# Patient Record
Sex: Male | Born: 1995 | Race: White | Hispanic: No | Marital: Single | Smoking: Current some day smoker
Health system: Southern US, Community
[De-identification: ages and names within clinical notes are randomized; demographics above are authoritative.]

---

## 2014-10-12 ENCOUNTER — Emergency Department: Payer: 59

## 2014-10-12 ENCOUNTER — Encounter: Payer: Self-pay | Admitting: General Practice

## 2014-10-12 ENCOUNTER — Emergency Department
Admission: EM | Admit: 2014-10-12 | Discharge: 2014-10-12 | Disposition: A | Discharge status: Home / Self Care | Payer: 59 | Attending: Emergency Medicine | Admitting: Emergency Medicine

## 2014-10-12 DIAGNOSIS — Z720 Tobacco use: Secondary | ICD-10-CM | POA: Insufficient documentation

## 2014-10-12 DIAGNOSIS — W25XXXA Contact with sharp glass, initial encounter: Secondary | ICD-10-CM | POA: Diagnosis not present

## 2014-10-12 DIAGNOSIS — Y92009 Unspecified place in unspecified non-institutional (private) residence as the place of occurrence of the external cause: Secondary | ICD-10-CM | POA: Insufficient documentation

## 2014-10-12 DIAGNOSIS — Y998 Other external cause status: Secondary | ICD-10-CM | POA: Insufficient documentation

## 2014-10-12 DIAGNOSIS — S91311A Laceration without foreign body, right foot, initial encounter: Secondary | ICD-10-CM | POA: Diagnosis not present

## 2014-10-12 DIAGNOSIS — Y9389 Activity, other specified: Secondary | ICD-10-CM | POA: Insufficient documentation

## 2014-10-12 MED ORDER — KETOROLAC TROMETHAMINE 10 MG PO TABS
ORAL_TABLET | ORAL | Status: AC
  Administered 2014-10-12: 10 mg via ORAL
  Filled 2014-10-12: qty 1

## 2014-10-12 MED ORDER — BACITRACIN ZINC 500 UNIT/GM EX OINT: TOPICAL_OINTMENT | CUTANEOUS | Status: AC

## 2014-10-12 MED ORDER — TRAMADOL HCL 50 MG PO TABS
50.0000 mg | ORAL_TABLET | Freq: Once | ORAL | Status: AC
  Administered 2014-10-12: 50 mg via ORAL

## 2014-10-12 MED ORDER — KETOROLAC TROMETHAMINE 10 MG PO TABS
10.0000 mg | ORAL_TABLET | Freq: Once | ORAL | Status: AC
  Administered 2014-10-12: 10 mg via ORAL

## 2014-10-12 MED ORDER — CEPHALEXIN 500 MG PO CAPS: 500.0000 mg | ORAL_CAPSULE | Freq: Four times a day (QID) | ORAL | Status: AC

## 2014-10-12 MED ORDER — TRAMADOL HCL 50 MG PO TABS
ORAL_TABLET | ORAL | Status: AC
  Administered 2014-10-12: 50 mg via ORAL
  Filled 2014-10-12: qty 1

## 2014-10-12 MED ORDER — BACITRACIN ZINC 500 UNIT/GM EX OINT
TOPICAL_OINTMENT | CUTANEOUS | Status: AC
Start: 2014-10-12 — End: 2014-10-12
  Administered 2014-10-12: 1 via TOPICAL
  Filled 2014-10-12: qty 0.9

## 2014-10-12 MED ORDER — IBUPROFEN 800 MG PO TABS: 800.0000 mg | ORAL_TABLET | Freq: Three times a day (TID) | ORAL | Status: AC | PRN

## 2014-10-12 MED ORDER — BACITRACIN 500 UNIT/GM EX OINT
1.0000 "application " | TOPICAL_OINTMENT | Freq: Two times a day (BID) | CUTANEOUS | Status: DC
  Filled 2014-10-12 (×2): qty 0.9

## 2014-10-12 MED ORDER — CEPHALEXIN 500 MG PO CAPS
500.0000 mg | ORAL_CAPSULE | Freq: Once | ORAL | Status: AC
  Administered 2014-10-12: 500 mg via ORAL

## 2014-10-12 MED ORDER — BACITRACIN ZINC 500 UNIT/GM EX OINT
TOPICAL_OINTMENT | CUTANEOUS | Status: AC
  Filled 2014-10-12: qty 0.9

## 2014-10-12 MED ORDER — BACITRACIN 500 UNIT/GM EX OINT
1.0000 | TOPICAL_OINTMENT | Freq: Once | CUTANEOUS | Status: AC
Start: 2014-10-12 — End: 2014-10-12
  Administered 2014-10-12: 1 via TOPICAL

## 2014-10-12 MED ORDER — CEPHALEXIN 500 MG PO CAPS
ORAL_CAPSULE | ORAL | Status: AC
  Administered 2014-10-12: 500 mg via ORAL
  Filled 2014-10-12: qty 1

## 2014-10-12 NOTE — ED Notes (Signed)
Pt. Left without E signature, decline VS recheck at this time.

## 2014-10-12 NOTE — ED Notes (Signed)
Pt. Arrived to ed from home with reports of wound and painful right foot after stepping on glass last night. Pt alert and oriented. Wound noted to bottom of right foot. Bleeding control at this time.

## 2014-10-12 NOTE — ED Provider Notes (Signed)
[facility] Emergency Department Provider Note  ____________________________________________  Time seen: Approximately 1736  I have reviewed the triage vital signs and the nursing notes.   HISTORY  Chief Complaint Foot Pain and Laceration    HPI Juan Wells is a 19 y.o. male who sometime yesterday night between 10 and midnight stepped on a piece of glass cutting his foot has a wound that is healing oozing a little bit of clear fluid hurts when he walks on his concern that it may have some glass in it experiencing no functional deficit rates his pain as about a 3-10 depending on if he puts weight on it or if he's leaving it alone movement making it worse rest making it better no other associated signs or symptoms except for previously noted   No past medical history on file.  There are no active problems to display for this patient.   No past surgical history on file.  Current Outpatient Rx  Name  Route  Sig  Dispense  Refill  . bacitracin ointment      Apply to affected area daily   30 g   0   . cephALEXin (KEFLEX) 500 MG capsule   Oral   Take 1 capsule (500 mg total) by mouth 4 (four) times daily.   40 capsule   0   . ibuprofen (ADVIL,MOTRIN) 800 MG tablet   Oral   Take 1 tablet (800 mg total) by mouth every 8 (eight) hours as needed.   30 tablet   0     Allergies Review of patient's allergies indicates no known allergies.  No family history on file.  Social History History  Substance Use Topics  . Smoking status: Current Some Day Smoker -- 0.00 packs/day  . Smokeless tobacco: Not on file  . Alcohol Use: 3.6 oz/week    6 Cans of beer per week    Review of Systems Constitutional: No fever/chills Eyes: No visual changes. ENT: No sore throat. Cardiovascular: Denies chest pain. Respiratory: Denies shortness of breath. Gastrointestinal: No abdominal pain.  No nausea, no vomiting.  No diarrhea.  No constipation. Genitourinary:  Negative for dysuria. Musculoskeletal: Negative for back pain. Skin: Negative for rash. Neurological: Negative for headaches, focal weakness or numbness.  10-point ROS otherwise negative.  ____________________________________________   PHYSICAL EXAM:  VITAL SIGNS: ED Triage Vitals  Enc Vitals Group     BP 10/12/14 1739 153/90 mmHg     Pulse Rate 10/12/14 1739 99     Resp 10/12/14 1739 17     Temp 10/12/14 1739 98.3 F (36.8 C)     Temp Source 10/12/14 1739 Oral     SpO2 10/12/14 1739 97 %     Weight 10/12/14 1739 160 lb (72.576 kg)     Height 10/12/14 1739 6\' 2"  (1.88 m)     Head Cir --      Peak Flow --      Pain Score 10/12/14 1740 5     Pain Loc --      Pain Edu? --      Excl. in GC? --     Constitutional: Alert and oriented. Well appearing and in no acute distress. Eyes: Conjunctivae are normal. PERRL. EOMI. Head: Atraumatic. Nose: No congestion/rhinnorhea. Mouth/Throat: Mucous membranes are moist.  Oropharynx non-erythematous. Neck: No stridor.   Cardiovascular: Normal rate, regular rhythm. Grossly normal heart sounds.  Good peripheral circulation. Respiratory: Normal respiratory effort.  No retractions. Lungs CTAB. Gastrointestinal: Soft and nontender.  No distention. No abdominal bruits. No CVA tenderness. Musculoskeletal: Pain with palpation along the right fifth metatarsal.  No joint effusions. Neurologic:  Normal speech and language. No gross focal neurologic deficits are appreciated. Speech is normal. No gait instability. Skin: Healing laceration approximately 1.5 cm in size along the base of the right fifth metatarsal mild redness around the wound Psychiatric: Mood and affect are normal. Speech and behavior are normal.  ____________________________________________    RADIOLOGY  X-rays negative for fracture or foreign body ____________________________________________   PROCEDURES  Procedure(s) performed:   Critical Care performed:  No  ____________________________________________   INITIAL IMPRESSION / ASSESSMENT AND PLAN / ED COURSE  Pertinent labs & imaging results that were available during my care of the patient were reviewed by me and considered in my medical decision making (see chart for details).  Initial impression this patient for laceration of his right foot old and healing patient's foot was dressed he was given crutches postop shoe be started on Keflex and Motrin follow-up with orthopedics in 2-3 days if he has any further concerns return here for any acute concerns or worsening symptoms ____________________________________________   FINAL CLINICAL IMPRESSION(S) / ED DIAGNOSES  Final diagnoses:  Foot laceration, right, initial encounter     Jasan Doughtie Rosalyn GessWilliam C Anacarolina Evelyn, PA-C 10/12/14 1858  Sharman CheekPhillip Stafford, MD 10/13/14 0040

## 2015-11-02 IMAGING — CR DG FOOT COMPLETE 3+V*R*
1 series · 3 of 3 positions shown · non-contrast
Comparison: None.

CLINICAL DATA: Wound to lateral aspect of right heel after stepping
on glass today

EXAM:
RIGHT FOOT COMPLETE - 3+ VIEW

[Series 1: ap · 0.17mm/px · 3 of 3 slices shown]
[im 1/3]
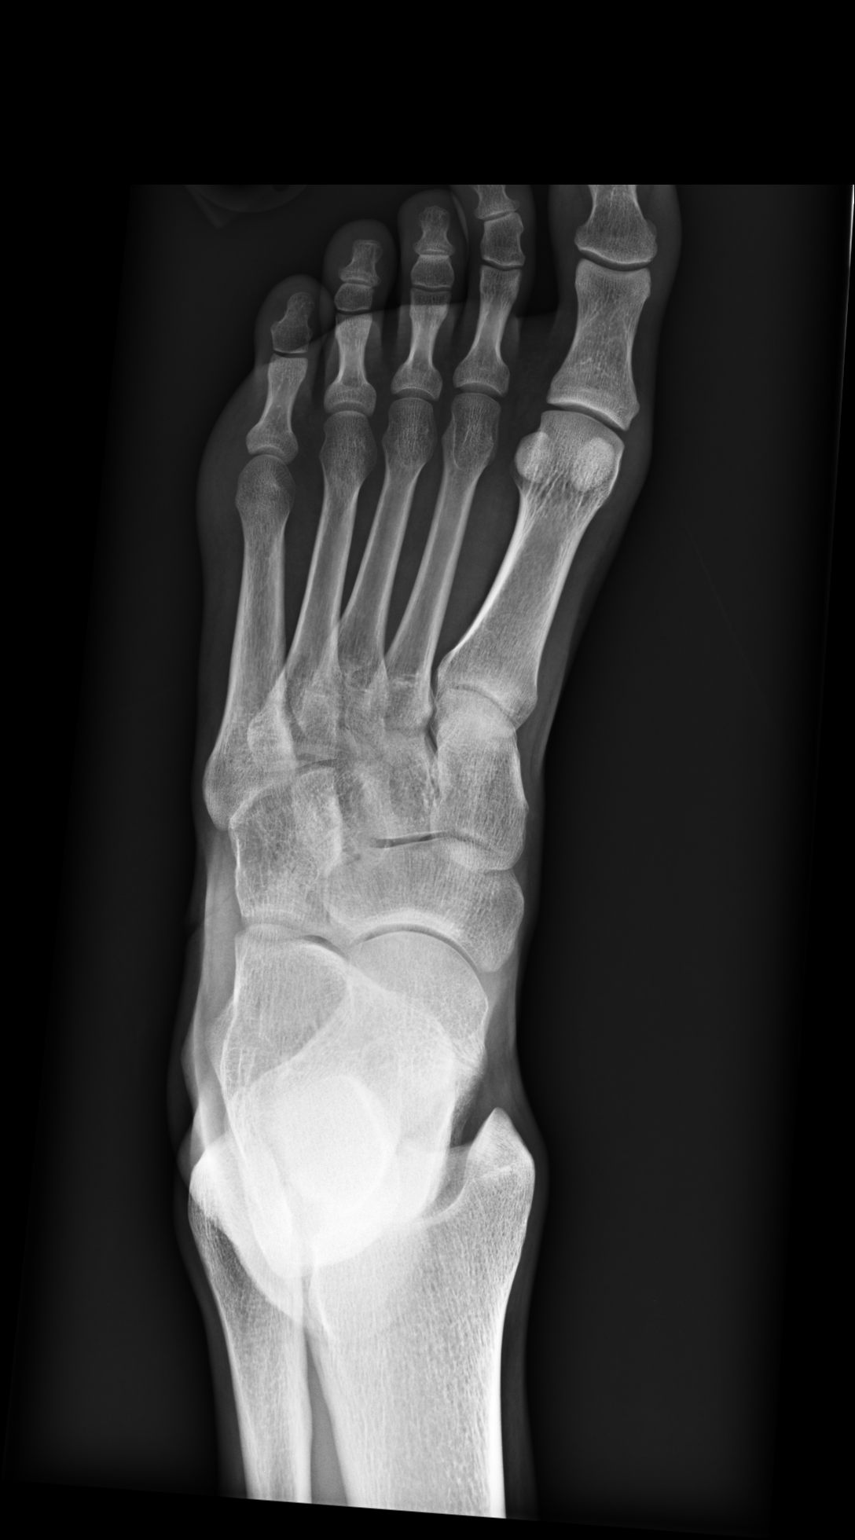
[im 2/3]
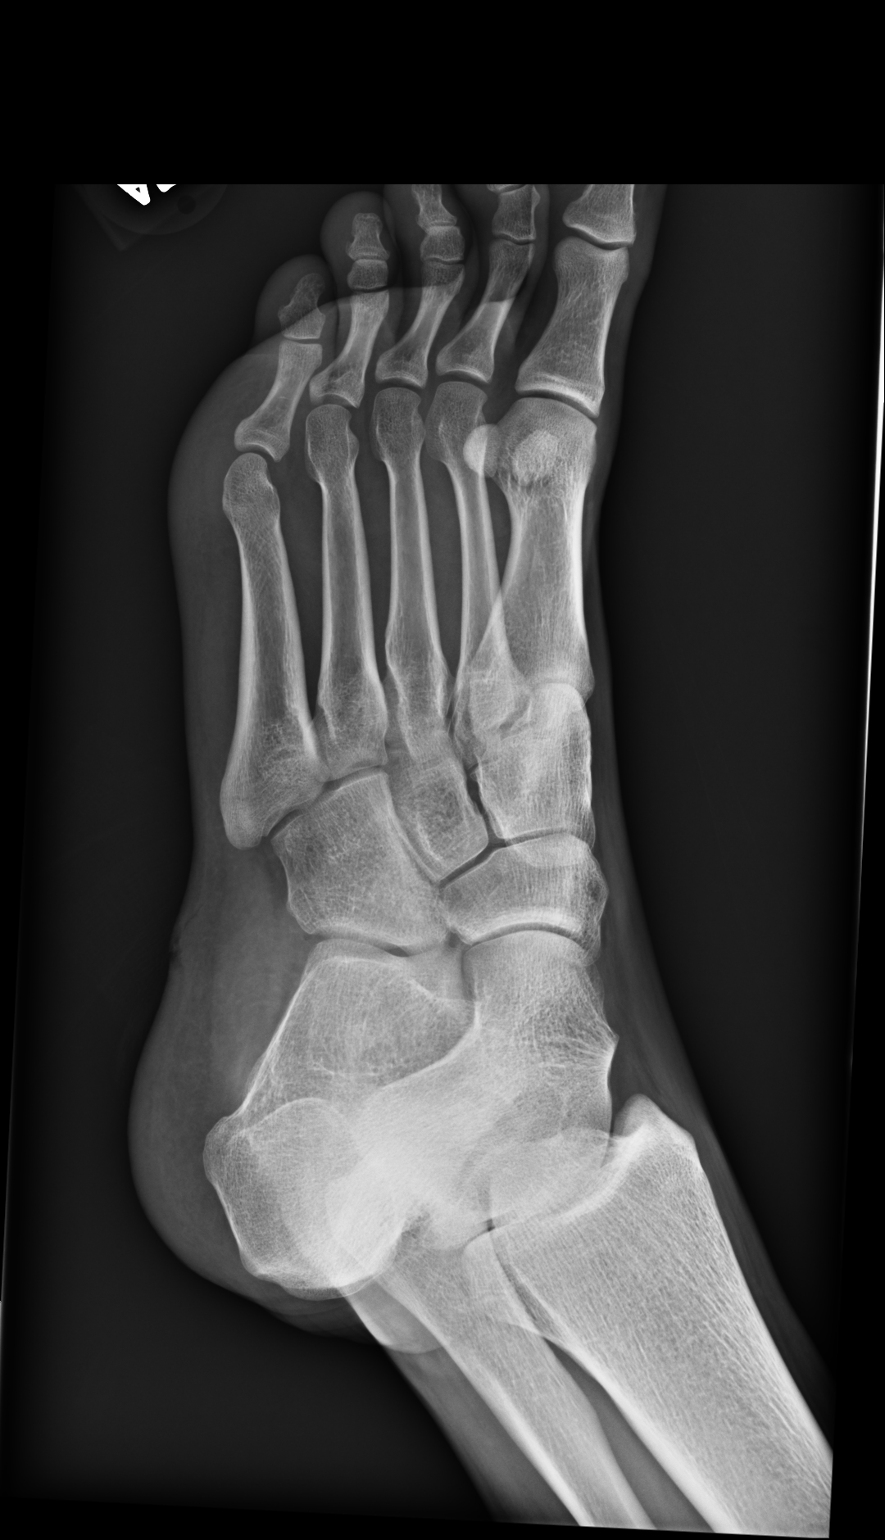
[im 3/3]
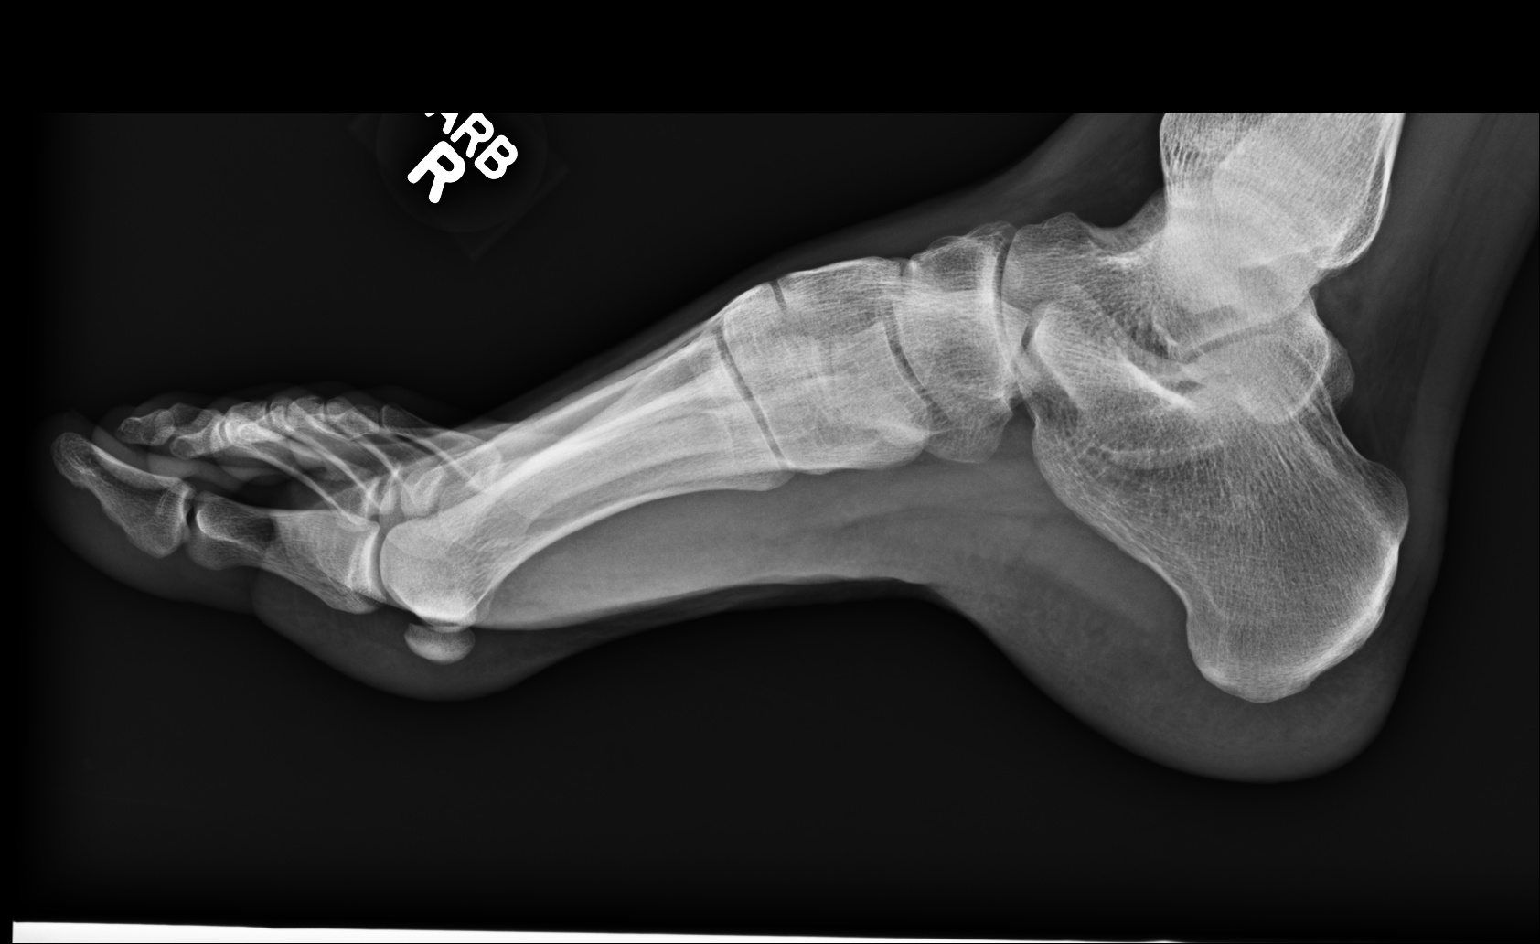

[3 of 3 positions shown; findings below may reference images not displayed]

FINDINGS: Three views of the right foot are negative for fracture, dislocation
or radiopaque foreign body.
IMPRESSION: No bony abnormality. No radiopaque foreign body. Glass can sometimes
be completely inapparent on radiography; if there is strong clinical
suspicion of glass in the soft tissues, directed ultrasound may be
helpful.

## 2016-10-02 ENCOUNTER — Emergency Department
Admission: EM | Admit: 2016-10-02 | Discharge: 2016-10-02 | Disposition: A | Discharge status: Home / Self Care | Payer: Managed Care, Other (non HMO) | Attending: Emergency Medicine | Admitting: Emergency Medicine

## 2016-10-02 DIAGNOSIS — Y939 Activity, unspecified: Secondary | ICD-10-CM | POA: Diagnosis not present

## 2016-10-02 DIAGNOSIS — S6991XA Unspecified injury of right wrist, hand and finger(s), initial encounter: Secondary | ICD-10-CM | POA: Diagnosis not present

## 2016-10-02 DIAGNOSIS — Y929 Unspecified place or not applicable: Secondary | ICD-10-CM | POA: Diagnosis not present

## 2016-10-02 DIAGNOSIS — Y999 Unspecified external cause status: Secondary | ICD-10-CM | POA: Diagnosis not present

## 2016-10-02 DIAGNOSIS — W01198A Fall on same level from slipping, tripping and stumbling with subsequent striking against other object, initial encounter: Secondary | ICD-10-CM | POA: Diagnosis not present

## 2016-10-02 DIAGNOSIS — S060X0A Concussion without loss of consciousness, initial encounter: Secondary | ICD-10-CM | POA: Diagnosis not present

## 2016-10-02 DIAGNOSIS — S0990XA Unspecified injury of head, initial encounter: Secondary | ICD-10-CM | POA: Diagnosis present

## 2016-10-02 DIAGNOSIS — F17200 Nicotine dependence, unspecified, uncomplicated: Secondary | ICD-10-CM | POA: Insufficient documentation

## 2016-10-02 NOTE — Discharge Instructions (Signed)
You have bruising to her hand. As we discussed, I cannot rule out fracture without x-ray but you  would prefer not to have an x-ray. This is certainly your choice but if you change your mind, or feels worse please return to the emergency department, please follow closely with orthopedic surgery if it continues to hurt.  After discussion, we have decided not to obtain a CT scan of your head. If you have increased headache, persistent vomiting, numbness, weakness, or you feel worse in any way return to the emergency room. Avoid all activities that could result in a closed head injury, follow-up with neurology.

## 2016-10-02 NOTE — ED Triage Notes (Signed)
Pt states that he fell head first into a wood wall last night, pt denies loc, pt states that the time around the injury is "fuzzy" pt ambulatory to triage without difficulty, however pt states that he does feel off balance. Pt reports headache, states that he has a hx of concussions as a child, pt reports nausea as well

## 2016-10-02 NOTE — ED Notes (Signed)
Pt states that he tripped last night and hit his head on a wall. Pt states that he vomited x 1 last night and x 1 this morning. Pt states that he has been nauseated all day. Pt states that he feels "fuzzy and out of it". Pt states that he is a little "wobbly" on his feet.

## 2016-10-02 NOTE — ED Provider Notes (Signed)
[facility] Emergency Department Provider Note  ____________________________________________   I have reviewed the triage vital signs and the nursing notes.   HISTORY  Chief Complaint Head Injury    HPI Juan Wells is a 21 y.o. male who states he had 3 or 4 beers last night was walking and fell and bumped his head on a wooden wall. Did not pass out. Did vomit once yesterday and once this morning. Did have a headache, which seems similar to prior hangoversalthough may be somewhat worse. He vomited once this morning. He has an abrasion to his head and bruising to his right hand otherwise no injury. He states that he fell a little "woozy" today, he has a slight headache which is nearly gone. He has had no neurologic deficits. He denies any change in vision or hearing or difficulty walking talking or eating. He is no longer nauseated. He has had several things to eat today with no difficulty. He does have a history of concussions when he was younger. He wanted to be "checked out". He feels that he has a concussion he feels like he did when he had prior concussions many years ago as a high [employer]. He has no other complaints. He states he does not want to have a CT scan if possible.      No past medical history on file.  There are no active problems to display for this patient.   No past surgical history on file.  Prior to Admission medications   Medication Sig Start Date End Date Taking? Authorizing Provider  ibuprofen (ADVIL,MOTRIN) 800 MG tablet Take 1 tablet (800 mg total) by mouth every 8 (eight) hours as needed. 10/12/14   III Rosalyn Gess, PA-C    Allergies Patient has no known allergies.  No family history on file.  Social History Social History  Substance Use Topics  . Smoking status: Current Some Day Smoker    Packs/day: 0.00  . Smokeless tobacco: Not on file  . Alcohol use 3.6 oz/week    6 Cans of beer per week    Review of  Systems Constitutional: No fever/chills Eyes: No visual changes. ENT: No sore throat. No stiff neck no neck pain Cardiovascular: Denies chest pain. Respiratory: Denies shortness of breath. Gastrointestinal:   no ongoing vomiting.  No diarrhea.  No constipation. Genitourinary: Negative for dysuria. Musculoskeletal: Negative lower extremity swelling Skin: Negative for rash. Neurological: Negative for severe headaches, focal weakness or numbness. 10-point ROS otherwise negative.  ____________________________________________   PHYSICAL EXAM:  VITAL SIGNS: ED Triage Vitals  Enc Vitals Group     BP 10/02/16 1722 (!) 149/85     Pulse Rate 10/02/16 1722 97     Resp 10/02/16 1722 18     Temp 10/02/16 1722 98.8 F (37.1 C)     Temp Source 10/02/16 1722 Oral     SpO2 10/02/16 1722 98 %     Weight 10/02/16 1723 170 lb (77.1 kg)     Height 10/02/16 1723  (1.88 m)     Head Circumference --      Peak Flow --      Pain Score 10/02/16 1722 7     Pain Loc --      Pain Edu? --      Excl. in GC? --     Constitutional: Alert and oriented. Well appearing and in no acute distress. Eyes: Conjunctivae are normal. PERRL. EOMI. Head: Slight abrasion to forehead no skull  fracture palpated. Nose: No congestion/rhinnorhea. Mouth/Throat: Mucous membranes are moist.  Oropharynx non-erythematous. Neck: No stridor.   Nontender with no meningismus Cardiovascular: Normal rate, regular rhythm. Grossly normal heart sounds.  Good peripheral circulation. Respiratory: Normal respiratory effort.  No retractions. Lungs CTAB. Abdominal: Soft and nontender. No distention. No guarding no rebound Back:  There is no focal tenderness or step off.  there is no midline tenderness there are no lesions noted. there is no CVA tenderness Musculoskeletal: No lower extremity tenderness, there is bruising to the dorsum of the right hand, no obvious bony tenderness. Pelvis range of motion of all fingers good grip strength  no other injury. No joint effusions, no DVT signs strong distal pulses no edema Neurologic: Cranial nerves II through XII are grossly intact 5 out of 5 strength bilateral upper and lower extremity. Finger to nose within normal limits heel to shin within normal limits, speech is normal with no word finding difficulty or dysarthria, reflexes symmetric, pupils are equally round and reactive to light, there is no pronator drift, sensation is normal, vision is intact to confrontation, gait is deferred, there is no nystagmus, normal neurologic exam Skin:  Skin is warm, dry and intact. No rash noted. Psychiatric: Mood and affect are normal. Speech and behavior are normal.  ____________________________________________   LABS (all labs ordered are listed, but only abnormal results are displayed)  Labs Reviewed - No data to display ____________________________________________  EKG  I personally interpreted any EKGs ordered by me or triage  ____________________________________________  RADIOLOGY  I reviewed any imaging ordered by me or triage that were performed during my shift and, if possible, patient and/or family made aware of any abnormal findings. ____________________________________________   PROCEDURES  Procedure(s) performed: None  Procedures  Critical Care performed: None  ____________________________________________   INITIAL IMPRESSION / ASSESSMENT AND PLAN / ED COURSE  Pertinent labs & imaging results that were available during my care of the patient were reviewed by me and considered in my medical decision making (see chart for details).  Patient here with concussion symptoms however is started 24 hours ago and symptoms are nearly gone. He denied discussed at length the risks benefits and alternatives to CT scan. I do not think at this time a CT scan would likely benefit the patient and it certainly has a risk of cancer or other complications. Patient would prefer not to  have a CT scan at this time and I think that this is the right approach. He has no neurologic deficits. 24 hours out he meets no criteria for CT. I have explained to him that any time should he feel worse he should come back and he understands return precautions and follow-up. I'm also concerned about the patient's hand. There is bruising. I've explained to him I cannot rule out fracture without x-ray but he declines. He understands the risks of declining. He understands the need for follow-up and the need to return for that as well. I am very comfortable the patient understands and is in agreement with all of these plans, he is eager to go home. He has no symptoms he has an NIH stroke scale of 0 with no indication of acute head injury aside from concussion. I did explain to him the need to absolutely abstain from closed head injury and I have suggested to him that he no longer drink alcohol.     ____________________________________________   FINAL CLINICAL IMPRESSION(S) / ED DIAGNOSES  Final diagnoses:  None  This chart was dictated using voice recognition software.  Despite best efforts to proofread,  errors can occur which can change meaning.      Jeanmarie Plant, MD 10/02/16 Rickey Primus
